# Patient Record
Sex: Female | Born: 1960 | Race: White | Hispanic: No | Marital: Married | Smoking: Never smoker
Health system: Southern US, Community
[De-identification: ages and names within clinical notes are randomized; demographics above are authoritative.]

## PROBLEM LIST (undated history)

## (undated) DIAGNOSIS — J029 Acute pharyngitis, unspecified: Secondary | ICD-10-CM

## (undated) DIAGNOSIS — M5030 Other cervical disc degeneration, unspecified cervical region: Secondary | ICD-10-CM

## (undated) DIAGNOSIS — Z8489 Family history of other specified conditions: Secondary | ICD-10-CM

## (undated) DIAGNOSIS — R112 Nausea with vomiting, unspecified: Secondary | ICD-10-CM

## (undated) DIAGNOSIS — Z9889 Other specified postprocedural states: Secondary | ICD-10-CM

## (undated) DIAGNOSIS — C801 Malignant (primary) neoplasm, unspecified: Secondary | ICD-10-CM

## (undated) DIAGNOSIS — M4316 Spondylolisthesis, lumbar region: Secondary | ICD-10-CM

## (undated) DIAGNOSIS — K589 Irritable bowel syndrome without diarrhea: Secondary | ICD-10-CM

## (undated) DIAGNOSIS — M5431 Sciatica, right side: Secondary | ICD-10-CM

## (undated) DIAGNOSIS — T753XXA Motion sickness, initial encounter: Secondary | ICD-10-CM

## (undated) DIAGNOSIS — M21371 Foot drop, right foot: Secondary | ICD-10-CM

## (undated) DIAGNOSIS — G43909 Migraine, unspecified, not intractable, without status migrainosus: Secondary | ICD-10-CM

## (undated) DIAGNOSIS — D1779 Benign lipomatous neoplasm of other sites: Secondary | ICD-10-CM

## (undated) HISTORY — DX: Irritable bowel syndrome without diarrhea: K58.9

## (undated) HISTORY — PX: OOPHORECTOMY: SHX86

## (undated) HISTORY — PX: TONSILLECTOMY AND ADENOIDECTOMY: SUR1326

## (undated) HISTORY — PX: SKIN BIOPSY: SHX1

## (undated) HISTORY — DX: Migraine, unspecified, not intractable, without status migrainosus: G43.909

---

## 1993-01-09 HISTORY — PX: APPENDECTOMY: SHX54

## 1995-01-10 HISTORY — PX: LAPAROSCOPIC OOPHERECTOMY: SHX6507

## 1996-01-10 HISTORY — PX: CHOLECYSTECTOMY: SHX55

## 1997-01-09 HISTORY — PX: ABDOMINAL HYSTERECTOMY: SHX81

## 2006-02-01 ENCOUNTER — Ambulatory Visit: Payer: Self-pay | Admitting: Internal Medicine

## 2007-01-23 ENCOUNTER — Ambulatory Visit: Payer: Self-pay | Admitting: Internal Medicine

## 2007-02-05 ENCOUNTER — Ambulatory Visit: Payer: Self-pay | Admitting: General Practice

## 2008-02-06 ENCOUNTER — Ambulatory Visit: Payer: Self-pay | Admitting: Internal Medicine

## 2008-03-25 ENCOUNTER — Ambulatory Visit: Payer: Self-pay | Admitting: Obstetrics and Gynecology

## 2008-03-27 ENCOUNTER — Ambulatory Visit: Payer: Self-pay | Admitting: Obstetrics and Gynecology

## 2010-05-03 ENCOUNTER — Ambulatory Visit: Payer: Self-pay | Admitting: Internal Medicine

## 2010-12-16 ENCOUNTER — Ambulatory Visit: Payer: Self-pay | Admitting: General Practice

## 2011-01-10 HISTORY — PX: COLONOSCOPY: SHX174

## 2011-11-14 ENCOUNTER — Ambulatory Visit: Payer: Self-pay | Admitting: General Surgery

## 2012-04-23 ENCOUNTER — Ambulatory Visit: Payer: Self-pay | Admitting: Internal Medicine

## 2013-08-21 ENCOUNTER — Ambulatory Visit: Payer: Self-pay | Admitting: Internal Medicine

## 2014-08-26 ENCOUNTER — Other Ambulatory Visit: Payer: Self-pay | Admitting: Internal Medicine

## 2014-08-26 DIAGNOSIS — Z1231 Encounter for screening mammogram for malignant neoplasm of breast: Secondary | ICD-10-CM

## 2014-09-02 ENCOUNTER — Ambulatory Visit: Payer: Self-pay

## 2014-10-22 ENCOUNTER — Ambulatory Visit
Admission: RE | Admit: 2014-10-22 | Discharge: 2014-10-22 | Disposition: A | Discharge status: Home / Self Care | Payer: BLUE CROSS/BLUE SHIELD | Source: Ambulatory Visit | Attending: Internal Medicine | Admitting: Internal Medicine

## 2014-10-22 DIAGNOSIS — Z1231 Encounter for screening mammogram for malignant neoplasm of breast: Secondary | ICD-10-CM | POA: Diagnosis present

## 2014-10-22 HISTORY — DX: Malignant (primary) neoplasm, unspecified: C80.1

## 2015-08-30 ENCOUNTER — Other Ambulatory Visit: Payer: Self-pay | Admitting: Internal Medicine

## 2015-08-30 DIAGNOSIS — Z1231 Encounter for screening mammogram for malignant neoplasm of breast: Secondary | ICD-10-CM

## 2015-10-25 ENCOUNTER — Ambulatory Visit
Admission: RE | Admit: 2015-10-25 | Discharge: 2015-10-25 | Disposition: A | Discharge status: Home / Self Care | Payer: BLUE CROSS/BLUE SHIELD | Source: Ambulatory Visit | Attending: Internal Medicine | Admitting: Internal Medicine

## 2015-10-25 DIAGNOSIS — Z1231 Encounter for screening mammogram for malignant neoplasm of breast: Secondary | ICD-10-CM | POA: Diagnosis not present

## 2015-11-01 ENCOUNTER — Encounter: Payer: Self-pay | Admitting: *Deleted

## 2015-11-08 ENCOUNTER — Encounter: Payer: Self-pay | Admitting: General Surgery

## 2015-11-08 ENCOUNTER — Ambulatory Visit (INDEPENDENT_AMBULATORY_CARE_PROVIDER_SITE_OTHER): Payer: BLUE CROSS/BLUE SHIELD | Admitting: General Surgery

## 2015-11-08 VITALS — BP 150/78 | HR 85 | Resp 14 | Ht 66.0 in | Wt 209.0 lb

## 2015-11-08 DIAGNOSIS — K625 Hemorrhage of anus and rectum: Secondary | ICD-10-CM

## 2015-11-08 DIAGNOSIS — R1032 Left lower quadrant pain: Secondary | ICD-10-CM | POA: Diagnosis not present

## 2015-11-08 LAB — POC HEMOCCULT BLD/STL (OFFICE/1-CARD/DIAGNOSTIC): FECAL OCCULT BLD: NEGATIVE

## 2015-11-08 MED ORDER — HYOSCYAMINE SULFATE 0.125 MG SL SUBL: 0.1250 mg | SUBLINGUAL_TABLET | SUBLINGUAL | 0 refills | Status: DC | PRN

## 2015-11-08 NOTE — Progress Notes (Signed)
Patient ID: Crystal Bridges, female   DOB: [birth year], 54 y.o.   MRN: [id number]  Chief Complaint  Patient presents with  . Other    rectal bleeding    HPI Crystal Bridges is a 55 y.o. female here for evaluation of abdominal pain and rectal bleeding. She has a history of IBS. She complains of left lower abdominal pain for the past 6 months. She states that the pain almost feels like something twisted. She does report some rectal bleeding after using the bathroom following the pain. This has happened three times with some mucus on the last episode. She states that the pain flairs up and then goes away. It may last 2 hours to 2 days. She does report nausea with most episodes and dry heaves with two. She feels like her symptoms are worsening. Episodes are occurring about twice a month but increasing in intensity. HPI  Past Medical History:  Diagnosis Date  . Cancer ([place])    skin  . IBS (irritable bowel syndrome)   . Migraines     Past Surgical History:  Procedure Laterality Date  . ABDOMINAL HYSTERECTOMY  1999  . APPENDECTOMY  1995  . CHOLECYSTECTOMY  1998  . COLONOSCOPY  2013  . LAPAROSCOPIC OOPHERECTOMY Right 1997  . TONSILLECTOMY AND ADENOIDECTOMY  as child    Family History  Problem Relation Age of Onset  . Thyroid disease Mother   . Dementia Father   . Stroke Father   . Breast cancer Neg Hx     Social History Social History  Substance Use Topics  . Smoking status: Never Smoker  . Smokeless tobacco: Never Used  . Alcohol use No    Allergies  Allergen Reactions  . Penicillins Hives    Current Outpatient Prescriptions  Medication Sig Dispense Refill  . butalbital-acetaminophen-caffeine (FIORICET, ESGIC) 50-325-40 MG tablet Take 1 tablet by mouth as needed.  2  . DULoxetine (CYMBALTA) 30 MG capsule Take 30 mg by mouth daily.  4  . furosemide (LASIX) 20 MG tablet Take 20 mg by mouth as needed.  3  . LORazepam (ATIVAN) 1 MG tablet Take 1 mg by mouth as needed.    .  hyoscyamine (LEVSIN SL) 0.125 MG SL tablet Place 1 tablet (0.125 mg total) under the tongue every 4 (four) hours as needed. 30 tablet 0   No current facility-administered medications for this visit.     Review of Systems Review of Systems  Constitutional: Negative.   HENT: Negative.   Eyes: Negative.   Respiratory: Negative.   Cardiovascular: Negative.   Gastrointestinal: Positive for abdominal distention, abdominal pain (left lower), anal bleeding, constipation and nausea. Negative for blood in stool, diarrhea, rectal pain and vomiting.  Endocrine: Negative.   Genitourinary: Negative.   Musculoskeletal: Negative.   Skin: Negative.   Neurological: Negative.   Hematological: Negative.   Psychiatric/Behavioral: Negative.     Blood pressure (!) 150/78, pulse 85, resp. rate 14, height '5\' 6"'$  (1.676 m), weight 209 lb (94.8 kg).  Physical Exam Physical Exam  Constitutional: She is oriented to person, place, and time. She appears well-developed and well-nourished.  Eyes: Conjunctivae are normal. No scleral icterus.  Neck: Neck supple.  Cardiovascular: Normal rate, regular rhythm and normal heart sounds.   Pulmonary/Chest: Effort normal and breath sounds normal.  Abdominal: Soft. Normal appearance and bowel sounds are normal. There is tenderness in the left lower quadrant.    No abdominal bruits noted.  Genitourinary: Rectum normal. Rectal exam shows guaiac  negative stool.  Lymphadenopathy:    She has no cervical adenopathy.  Neurological: She is alert and oriented to person, place, and time.  Skin: Skin is warm and dry.  Psychiatric: She has a normal mood and affect.    Data Reviewed 10/02/2011 office note was reviewed. Patient was seen at that time for evidence of rectal bleeding and left lower quadrant abdominal pain. Colonoscopy completed on 11/14/2011 was normal.  Laboratory studies completed with her PCP on 08/30/2015 were reviewed. White blood cell count 6400 with normal  differential, hemoglobin 14.0. Platelet count 3 and 1000. Fasting blood sugar 100, creatinine 0.85, estimated GFR 78, liver function studies were normal for a scant elevation of the SGPT at 44. Normal lipid studies. Normal TSH.  Operative report from her 07/03/2001 abdominal exploration and appendectomy completed for severe abdominal pain and abnormal CT scan was reviewed. The CT had suggested a focal abnormality of the small bowel or sigmoid colon with a suggestion of an obstructing lesion. The patient was found to have normal bowel, normal vasculature, fibrotic appendix and a scant amount of free fluid. A 8 mm ellipsoid area was found free floating and this was fat necrosis on pathology.  Assessment    Left lower quadrant abdominal pain with normal colonoscopy in 2013.    Plan     The patient was given a prescription for Levsin 0.125 mg tablets for sublingual use. She is used to successfully in the past.  Follow-up will take place after her upcoming CT scan. Patient has been scheduled for a CT abdomen/pelvis with contrast at [facility] for 11-16-15 at 8:30 am (arrive 8:15 am). Prep: no solids 4 hours prior and pick up prep kit. Patient verbalizes understanding.      This has been scribed by Caryl-Lyn Otis Brace LPN   Robert Bellow 11/09/2015, 12:50 PM

## 2015-11-08 NOTE — Patient Instructions (Signed)
CT abdomen pelvis with contrast 

## 2015-11-09 DIAGNOSIS — R1032 Left lower quadrant pain: Secondary | ICD-10-CM | POA: Insufficient documentation

## 2015-11-09 DIAGNOSIS — K625 Hemorrhage of anus and rectum: Secondary | ICD-10-CM | POA: Insufficient documentation

## 2015-11-16 ENCOUNTER — Ambulatory Visit
Admission: RE | Admit: 2015-11-16 | Discharge: 2015-11-16 | Disposition: A | Discharge status: Home / Self Care | Payer: BLUE CROSS/BLUE SHIELD | Source: Ambulatory Visit | Attending: General Surgery | Admitting: General Surgery

## 2015-11-16 DIAGNOSIS — N281 Cyst of kidney, acquired: Secondary | ICD-10-CM | POA: Diagnosis not present

## 2015-11-16 DIAGNOSIS — Z90710 Acquired absence of both cervix and uterus: Secondary | ICD-10-CM | POA: Diagnosis not present

## 2015-11-16 DIAGNOSIS — K760 Fatty (change of) liver, not elsewhere classified: Secondary | ICD-10-CM | POA: Insufficient documentation

## 2015-11-16 DIAGNOSIS — K625 Hemorrhage of anus and rectum: Secondary | ICD-10-CM | POA: Diagnosis present

## 2015-11-16 DIAGNOSIS — R1032 Left lower quadrant pain: Secondary | ICD-10-CM | POA: Insufficient documentation

## 2015-11-16 MED ORDER — IOPAMIDOL (ISOVUE-300) INJECTION 61%
100.0000 mL | Freq: Once | INTRAVENOUS | Status: AC | PRN
  Administered 2015-11-16: 100 mL via INTRAVENOUS

## 2015-11-17 ENCOUNTER — Encounter: Payer: Self-pay | Admitting: General Surgery

## 2015-11-17 ENCOUNTER — Telehealth: Payer: Self-pay | Admitting: *Deleted

## 2015-11-17 ENCOUNTER — Encounter: Payer: Self-pay | Admitting: *Deleted

## 2015-11-17 NOTE — Telephone Encounter (Signed)
-----   Message from Robert Bellow, MD sent at 11/16/2015  9:31 PM EST ----- Please notify the patient that I reviewed her CT. No troublesome findings. No evidence of blockage. Ask her to report if the Levsin is helpful with the next episode of abdominal pain. Please find out where her most recent colonoscopy was completed and by who. ----- Message ----- From: Interface, Rad Results In Sent: 11/16/2015  11:38 AM To: Robert Bellow, MD

## 2015-11-17 NOTE — Telephone Encounter (Signed)
Notified patient as instructed, patient pleased. Discussed follow-up appointments, patient agrees She will f/u via mychart regarding Levsin status. Last colonoscopy was 2013 Dr Bary Castilla.

## 2015-11-17 NOTE — Telephone Encounter (Signed)
Patient called you back, she stepped away from her desk but she said to call her when you get a chance.

## 2016-05-01 ENCOUNTER — Other Ambulatory Visit: Payer: Self-pay | Admitting: General Surgery

## 2016-05-02 ENCOUNTER — Encounter: Payer: Self-pay | Admitting: *Deleted

## 2016-08-07 ENCOUNTER — Encounter: Payer: Self-pay | Admitting: *Deleted

## 2016-08-14 NOTE — Anesthesia Preprocedure Evaluation (Addendum)
Anesthesia Evaluation  Patient identified by MRN, date of birth, ID band Patient awake    Reviewed: Allergy & Precautions, NPO status   History of Anesthesia Complications (+) PONV and history of anesthetic complications  Airway Mallampati: II  TM Distance: >3 FB     Dental no notable dental hx.    Pulmonary neg pulmonary ROS,    breath sounds clear to auscultation       Cardiovascular negative cardio ROS   Rhythm:Regular Rate:Normal     Neuro/Psych  Headaches, Anxiety    GI/Hepatic IBS   Endo/Other  BMI 34  Renal/GU      Musculoskeletal  (+) Arthritis ,   Abdominal   Peds  Hematology   Anesthesia Other Findings   Reproductive/Obstetrics                            Anesthesia Physical Anesthesia Plan  ASA: II  Anesthesia Plan: MAC   Post-op Pain Management:    Induction:   PONV Risk Score and Plan:   Airway Management Planned:   Additional Equipment:   Intra-op Plan:   Post-operative Plan:   Informed Consent:   Plan Discussed with:   Anesthesia Plan Comments:         Anesthesia Quick Evaluation

## 2016-08-14 NOTE — Discharge Instructions (Signed)
INSTRUCTIONS FOLLOWING OCULOPLASTIC SURGERY °AMY M. FOWLER, MD ° °AFTER YOUR EYE SURGERY, THER ARE MANY THINGS THWIHC YOU, THE PATIENT, CAN DO TO ASSURE THE BEST POSSIBLE RESULT FROM YOUR OPERATION.  THIS SHEET SHOULD BE REFERRED TO WHENEVER QUESTIONS ARISE.  IF THERE ARE ANY QUESTIONS NOT ANSWERED HERE, DO NOT HESITATE TO CALL OUR OFFICE AT [phone] OR [phone].  THERE IS ALWAYS [facility] AVAILABLE TO CALL IF QUESTIONS OR PROBLEMS ARISE. ° °VISION: Your vision may be blurred and out of focus after surgery until you are able to stop using your ointment, swelling resolves and your eye(s) heal. This may take 1 to 2 weeks at the least.  If your vision becomes gradually more dim or dark, this is not normal and you need to call our office immediately. ° °EYE CARE: For the first 48 hours after surgery, use ice packs frequently - “20 minutes on, 20 minutes off” - to help reduce swelling and bruising.  Small bags of frozen peas or corn make good ice packs along with cloths soaked in ice water.  If you are wearing a patch or other type of dressing following surgery, keep this on for the amount of time specified by your doctor.  For the first week following surgery, you will need to treat your stitches with great care.  If is OK to shower, but take care to not allow soapy water to run into your eye(s) to help reduce changes of infection.  You may gently clean the eyelashes and around the eye(s) with cotton balls and sterile water, BUT DO NOT RUB THE STITCHES VIGOROUSLY.  Keeping your stitches moist with ointment will help promote healing with minimal scar formation. ° °ACTIVITY: When you leave the surgery center, you should go home, rest and be inactive.  The eye(s) may feel scratchy and keeping the eyes closed will allow for faster healing.  The first week following surgery, avoid straining (anything making the face turn red) or lifting over 20 pounds.  Additionally, avoid bending which causes your head to go below  your waist.  Using your eyes will NOT harm them, so feel free to read, watch television, use the computer, etc as desired.  Driving depends on each individual, so check with your doctor if you have questions about driving. ° °MEDICATIONS:  You will be given a prescription for an ointment to use 4 times a day on your stitches.  You can use the ointment in your eyes if they feel scratchy or irritated.  If you eyelid(s) don’t close completely when you sleep, put some ointment in your eyes before bedtime. ° °EMERGENCY: If you experience SEVERE EYE PAIN OR HEADACHE UNRELIEVED BY TYLENOL OR PERCOCET, NAUSEA OR VOMITING, WORSENING REDNESS, OR WORSENING VISION (ESPECIALLY VISION THAT WA INITIALLY BETTER) CALL [phone] OR [phone] DURING BUSINESS HOURS OR AFTER HOURS. ° °General Anesthesia, Adult, Care After °These instructions provide you with information about caring for yourself after your procedure. Your health care provider may also give you more specific instructions. Your treatment has been planned according to current medical practices, but problems sometimes occur. Call your health care provider if you have any problems or questions after your procedure. °What can I expect after the procedure? °After the procedure, it is common to have: °· Vomiting. °· A sore throat. °· Mental slowness. ° °It is common to feel: °· Nauseous. °· Cold or shivery. °· Sleepy. °· Tired. °· Sore or achy, even in parts of your body where you did not have surgery. ° °  Follow these instructions at home: °For at least 24 hours after the procedure: °· Do not: °? Participate in activities where you could fall or become injured. °? Drive. °? Use heavy machinery. °? Drink alcohol. °? Take sleeping pills or medicines that cause drowsiness. °? Make important decisions or sign legal documents. °? Take care of children on your own. °· Rest. °Eating and drinking °· If you vomit, drink water, juice, or soup when you can drink without  vomiting. °· Drink enough fluid to keep your urine clear or pale yellow. °· Make sure you have little or no nausea before eating solid foods. °· Follow the diet recommended by your health care provider. °General instructions °· Have a responsible adult stay with you until you are awake and alert. °· Return to your normal activities as told by your health care provider. Ask your health care provider what activities are safe for you. °· Take over-the-counter and prescription medicines only as told by your health care provider. °· If you smoke, do not smoke without supervision. °· Keep all follow-up visits as told by your health care provider. This is important. °Contact a health care provider if: °· You continue to have nausea or vomiting at home, and medicines are not helpful. °· You cannot drink fluids or start eating again. °· You cannot urinate after 8-12 hours. °· You develop a skin rash. °· You have fever. °· You have increasing redness at the site of your procedure. °Get help right away if: °· You have difficulty breathing. °· You have chest pain. °· You have unexpected bleeding. °· You feel that you are having a life-threatening or urgent problem. °This information is not intended to replace advice given to you by your health care provider. Make sure you discuss any questions you have with your health care provider. °Document Released: 04/03/2000 Document Revised: 05/31/2015 Document Reviewed: 12/10/2014 °Elsevier Interactive Patient Education © 2018 [employer] ° °

## 2016-08-15 ENCOUNTER — Ambulatory Visit: Payer: BLUE CROSS/BLUE SHIELD | Admitting: Anesthesiology

## 2016-08-15 ENCOUNTER — Ambulatory Visit
Admission: RE | Admit: 2016-08-15 | Discharge: 2016-08-15 | Disposition: A | Discharge status: Home / Self Care | Payer: BLUE CROSS/BLUE SHIELD | Source: Ambulatory Visit | Attending: Ophthalmology | Admitting: Ophthalmology

## 2016-08-15 ENCOUNTER — Encounter
Admission: RE | Disposition: A | Discharge status: Home / Self Care | Payer: Self-pay | Source: Ambulatory Visit | Attending: Ophthalmology

## 2016-08-15 DIAGNOSIS — M1990 Unspecified osteoarthritis, unspecified site: Secondary | ICD-10-CM | POA: Diagnosis not present

## 2016-08-15 DIAGNOSIS — H02521 Blepharophimosis right upper eyelid: Secondary | ICD-10-CM | POA: Diagnosis not present

## 2016-08-15 DIAGNOSIS — F419 Anxiety disorder, unspecified: Secondary | ICD-10-CM | POA: Diagnosis not present

## 2016-08-15 DIAGNOSIS — Z79899 Other long term (current) drug therapy: Secondary | ICD-10-CM | POA: Diagnosis not present

## 2016-08-15 DIAGNOSIS — H02524 Blepharophimosis left upper eyelid: Secondary | ICD-10-CM | POA: Insufficient documentation

## 2016-08-15 HISTORY — DX: Motion sickness, initial encounter: T75.3XXA

## 2016-08-15 HISTORY — DX: Family history of other specified conditions: Z84.89

## 2016-08-15 HISTORY — DX: Other cervical disc degeneration, unspecified cervical region: M50.30

## 2016-08-15 HISTORY — PX: BROW LIFT: SHX178

## 2016-08-15 HISTORY — PX: PTOSIS REPAIR: SHX6568

## 2016-08-15 HISTORY — DX: Other specified postprocedural states: Z98.890

## 2016-08-15 HISTORY — DX: Nausea with vomiting, unspecified: R11.2

## 2016-08-15 SURGERY — BLEPHAROPLASTY
Anesthesia: Monitor Anesthesia Care | Site: Eye | Laterality: Bilateral

## 2016-08-15 MED ORDER — LACTATED RINGERS IV SOLN
10.0000 mL/h | INTRAVENOUS | Status: DC
  Administered 2016-08-15: 10 mL/h via INTRAVENOUS

## 2016-08-15 MED ORDER — LIDOCAINE-EPINEPHRINE 2 %-1:100000 IJ SOLN
INTRAMUSCULAR | Status: DC | PRN
  Administered 2016-08-15 (×2): .5 mL via OPHTHALMIC
  Administered 2016-08-15: 1 mL via OPHTHALMIC
  Administered 2016-08-15: .5 mL via OPHTHALMIC

## 2016-08-15 MED ORDER — ONDANSETRON HCL 4 MG/2ML IJ SOLN: 4.0000 mg | Freq: Once | INTRAMUSCULAR | Status: DC | PRN

## 2016-08-15 MED ORDER — OXYCODONE-ACETAMINOPHEN 5-325 MG PO TABS
1.0000 | ORAL_TABLET | ORAL | 0 refills | Status: AC | PRN
Start: 2016-08-15 — End: ?

## 2016-08-15 MED ORDER — PROPOFOL 500 MG/50ML IV EMUL
INTRAVENOUS | Status: DC | PRN
  Administered 2016-08-15: 25 ug/kg/min via INTRAVENOUS

## 2016-08-15 MED ORDER — MIDAZOLAM HCL 2 MG/2ML IJ SOLN
INTRAMUSCULAR | Status: DC | PRN
  Administered 2016-08-15: 2 mg via INTRAVENOUS

## 2016-08-15 MED ORDER — ERYTHROMYCIN 5 MG/GM OP OINT: TOPICAL_OINTMENT | OPHTHALMIC | 3 refills | Status: AC

## 2016-08-15 MED ORDER — ACETAMINOPHEN 325 MG PO TABS
325.0000 mg | ORAL_TABLET | ORAL | Status: DC | PRN
  Administered 2016-08-15: 650 mg via ORAL

## 2016-08-15 MED ORDER — ERYTHROMYCIN 5 MG/GM OP OINT
TOPICAL_OINTMENT | OPHTHALMIC | Status: DC | PRN
  Administered 2016-08-15: 1 via OPHTHALMIC

## 2016-08-15 MED ORDER — ALFENTANIL 500 MCG/ML IJ INJ
INJECTION | INTRAVENOUS | Status: DC | PRN
  Administered 2016-08-15: 250 ug via INTRAVENOUS
  Administered 2016-08-15 (×3): 500 ug via INTRAVENOUS

## 2016-08-15 MED ORDER — ACETAMINOPHEN 160 MG/5ML PO SOLN: 325.0000 mg | ORAL | Status: DC | PRN

## 2016-08-15 MED ORDER — TETRACAINE HCL 0.5 % OP SOLN
OPHTHALMIC | Status: DC | PRN
  Administered 2016-08-15: 2 [drp] via OPHTHALMIC

## 2016-08-15 MED ORDER — BSS IO SOLN
INTRAOCULAR | Status: DC | PRN
  Administered 2016-08-15: 10 mL

## 2016-08-15 SURGICAL SUPPLY — 36 items
APPLICATOR COTTON TIP WD 3 STR (MISCELLANEOUS) ×6 IMPLANT
BLADE SURG 15 STRL LF DISP TIS (BLADE) ×1 IMPLANT
BLADE SURG 15 STRL SS (BLADE) ×2
CORD BIP STRL DISP 12FT (MISCELLANEOUS) ×3 IMPLANT
DRAPE HEAD BAR (DRAPES) ×3 IMPLANT
GAUZE SPONGE 4X4 12PLY STRL (GAUZE/BANDAGES/DRESSINGS) ×3 IMPLANT
GAUZE SPONGE NON-WVN 2X2 STRL (MISCELLANEOUS) ×10 IMPLANT
GLOVE SURG LX 7.0 MICRO (GLOVE) ×4
GLOVE SURG LX STRL 7.0 MICRO (GLOVE) ×2 IMPLANT
MARKER SKIN XFINE TIP W/RULER (MISCELLANEOUS) ×3 IMPLANT
NEEDLE FILTER BLUNT 18X 1/2SAF (NEEDLE) ×2
NEEDLE FILTER BLUNT 18X1 1/2 (NEEDLE) ×1 IMPLANT
NEEDLE HYPO 30X.5 LL (NEEDLE) ×6 IMPLANT
PACK DRAPE NASAL/ENT (PACKS) ×3 IMPLANT
SOL PREP PVP 2OZ (MISCELLANEOUS) ×3
SOLUTION PREP PVP 2OZ (MISCELLANEOUS) ×1 IMPLANT
SPONGE VERSALON 2X2 STRL (MISCELLANEOUS) ×20
SUT CHROMIC 4-0 (SUTURE)
SUT CHROMIC 4-0 M2 12X2 ARM (SUTURE)
SUT CHROMIC 5 0 P 3 (SUTURE) IMPLANT
SUT ETHILON 4 0 CL P 3 (SUTURE) IMPLANT
SUT MERSILENE 4-0 S-2 (SUTURE) IMPLANT
SUT PDS AB 4-0 P3 18 (SUTURE) IMPLANT
SUT PLAIN GUT (SUTURE) ×3 IMPLANT
SUT PROLENE 5 0 P 3 (SUTURE) IMPLANT
SUT PROLENE 6 0 P 1 18 (SUTURE) ×6 IMPLANT
SUT SILK 4 0 G 3 (SUTURE) IMPLANT
SUT VIC AB 5-0 P-3 18X BRD (SUTURE) IMPLANT
SUT VIC AB 5-0 P3 18 (SUTURE)
SUT VICRYL 6-0  S14 CTD (SUTURE)
SUT VICRYL 6-0 S14 CTD (SUTURE) IMPLANT
SUT VICRYL 7 0 TG140 8 (SUTURE) IMPLANT
SUTURE CHRMC 4-0 M2 12X2 ARM (SUTURE) IMPLANT
SYR 3ML LL SCALE MARK (SYRINGE) ×3 IMPLANT
SYRINGE 10CC LL (SYRINGE) ×3 IMPLANT
WATER STERILE IRR 250ML POUR (IV SOLUTION) ×3 IMPLANT

## 2016-08-15 NOTE — H&P (Signed)
See the history and physical completed at [facility] on 07/26/16 and scanned into the chart.

## 2016-08-15 NOTE — Op Note (Signed)
Preoperative Diagnosis:  1. Visually significant blepharoptosis bilateral Upper Eyelid(s) 2. Visually significant dermatochalasis bilateral Upper Eyelid(s)  Postoperative Diagnosis:  Same.  Procedure(s) Performed:   1. Blepharoptosis repair with levator aponeurosis advancement bilateral Upper Eyelid(s) 2. Upper eyelid blepharoplasty with excess skin excision  bilateral Upper Eyelid(s)  Surgeon: Crystal Bridges. Crystal Bridges, M.D.  Assistants: none  Anesthesia: MAC  Specimens: None.  Estimated Blood Loss: Minimal.  Complications: None.  Operative Findings: None Dictated  Procedure:   Allergies were reviewed and the patient Penicillins.   After the risks, benefits, complications and alternatives were discussed with the patient, appropriate informed consent was obtained.  While seated in an upright position and looking in primary gaze, the mid pupillary line was marked on the upper eyelid margins bilaterally. The patient was then brought to the operating suite and reclined supine.  Timeout was conducted and the patient was sedated.  Local anesthetic consisting of a 50-50 mixture of 2% lidocaine with epinephrine and 0.75% bupivacaine with added Hylenex was injected subcutaneously to both upper eyelid(s). After adequate local was instilled, the patient was prepped and draped in the usual sterile fashion for eyelid surgery.   Attention was turned to the upper eyelids. A 50m upper eyelid crease incision line was marked with calipers on both upper eyelid(s).  A pinch test was used to estimate the amount of excess skin to remove and this was marked in standard blepharoplasty style fashion. Attention was turned to the  right upper eyelid. A #15 blade was used to open the premarked incision line. A skin only flap was excised and hemostasis was obtained with bipolar cautery.   Westcott scissors were then used to transect through orbicularis down to the tarsal plate. Epitarsus was dissected to create a smooth  surface to suture to. Dissection was then carried superiorly in the plane between orbicularis and orbital septum. Once the preaponeurotic fat pocket was identified, the orbital septum was opened. This revealed the levator and its aponeurosis.    Attention was then turned to the opposite eyelid where the same procedure was performed in the same manner. Hemostasis was obtained with bipolar cautery throughout.   3 interrupted 6-0 Prolene sutures were then passed partial thickness through the tarsal plates of both upper eyelid(s). These sutures were placed in line with the mid pupillary, medial limbal, and lateral limbal lines. The sutures were fixed to the levator aponeurosis and adjusted until a nice lid height and contour were achieved. Once nice symmetry was achieved, the skin incisions were closed with a running 6-0 fast absorbing plain suture. The patient tolerated the procedure well.  Erythromycin ophthalmic ointment was applied to the incision site(s) followed by ice packs. The patient was taken to the recovery area where she recovered without difficulty.  Post-Op Plan/Instructions:  The patient was instructed to use ice packs frequently for the next 48 hours. She was instructed to use erythromycin ophthalmic ointment on her incisions 4 times a day for the next 12 to 14 days. Shewas given a prescription for Percocet for pain control should Tylenol not be effective. [facility] or sooner as needed for problems.  Crystal Bridges M.D. Attending,Ophthalmology

## 2016-08-15 NOTE — Anesthesia Postprocedure Evaluation (Signed)
Anesthesia Post Note  Patient: Crystal Bridges  Procedure(s) Performed: Procedure(s) (LRB): BLEPHAROPLASTY UPPER EYELID WITH EXCESS SKIN (Bilateral) PTOSIS REPAIR RESECT EX (Bilateral)  Patient location during evaluation: PACU Anesthesia Type: MAC Level of consciousness: awake Pain management: pain level controlled Vital Signs Assessment: post-procedure vital signs reviewed and stable Respiratory status: spontaneous breathing, nonlabored ventilation and respiratory function stable Cardiovascular status: stable Postop Assessment: no signs of nausea or vomiting Anesthetic complications: no    Veda Canning

## 2016-08-15 NOTE — Transfer of Care (Signed)
Immediate Anesthesia Transfer of Care Note  Patient: Crystal Bridges  Procedure(s) Performed: Procedure(s): BLEPHAROPLASTY UPPER EYELID WITH EXCESS SKIN (Bilateral) PTOSIS REPAIR RESECT EX (Bilateral)  Patient Location: PACU  Anesthesia Type: MAC  Level of Consciousness: awake, alert  and patient cooperative  Airway and Oxygen Therapy: Patient Spontanous Breathing and Patient connected to supplemental oxygen  Post-op Assessment: Post-op Vital signs reviewed, Patient's Cardiovascular Status Stable, Respiratory Function Stable, Patent Airway and No signs of Nausea or vomiting  Post-op Vital Signs: Reviewed and stable  Complications: No apparent anesthesia complications

## 2016-08-15 NOTE — Anesthesia Procedure Notes (Signed)
Procedure Name: MAC Date/Time: 08/15/2016 11:34 AM Performed by: Janna Arch Pre-anesthesia Checklist: Patient identified, Emergency Drugs available, Suction available and Patient being monitored Patient Re-evaluated:Patient Re-evaluated prior to induction Oxygen Delivery Method: Nasal cannula

## 2016-08-15 NOTE — Interval H&P Note (Signed)
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 After consideration of risks, benefits and other options for treatment, the patient has consented to  Procedure(s): BLEPHAROPLASTY UPPER EYELID WITH EXCESS SKIN (Bilateral) PTOSIS REPAIR RESECT EX (Bilateral) as a surgical intervention .  The patient's history has been reviewed, patient examined, no change in status, stable for surgery.  I have reviewed the patient's chart and labs.  Questions were answered to the patient's satisfaction.     Vickki Muff, Crystal Bridges M

## 2016-08-16 ENCOUNTER — Encounter: Payer: Self-pay | Admitting: Ophthalmology

## 2016-10-06 ENCOUNTER — Other Ambulatory Visit: Payer: Self-pay | Admitting: Internal Medicine

## 2016-10-06 DIAGNOSIS — Z1231 Encounter for screening mammogram for malignant neoplasm of breast: Secondary | ICD-10-CM

## 2016-11-22 ENCOUNTER — Ambulatory Visit
Admission: RE | Admit: 2016-11-22 | Discharge: 2016-11-22 | Disposition: A | Discharge status: Home / Self Care | Payer: BLUE CROSS/BLUE SHIELD | Source: Ambulatory Visit | Attending: Internal Medicine | Admitting: Internal Medicine

## 2016-11-22 DIAGNOSIS — Z1231 Encounter for screening mammogram for malignant neoplasm of breast: Secondary | ICD-10-CM | POA: Insufficient documentation

## 2018-01-15 ENCOUNTER — Other Ambulatory Visit: Payer: Self-pay | Admitting: Internal Medicine

## 2018-01-15 DIAGNOSIS — Z1231 Encounter for screening mammogram for malignant neoplasm of breast: Secondary | ICD-10-CM

## 2018-02-20 ENCOUNTER — Ambulatory Visit
Admission: RE | Admit: 2018-02-20 | Discharge: 2018-02-20 | Disposition: A | Discharge status: Home / Self Care | Payer: 59 | Source: Ambulatory Visit | Attending: Internal Medicine | Admitting: Internal Medicine

## 2018-02-20 DIAGNOSIS — Z1231 Encounter for screening mammogram for malignant neoplasm of breast: Secondary | ICD-10-CM | POA: Diagnosis not present

## 2018-09-02 DIAGNOSIS — E876 Hypokalemia: Secondary | ICD-10-CM | POA: Insufficient documentation

## 2018-09-02 DIAGNOSIS — R319 Hematuria, unspecified: Secondary | ICD-10-CM | POA: Insufficient documentation

## 2018-09-02 DIAGNOSIS — F411 Generalized anxiety disorder: Secondary | ICD-10-CM | POA: Insufficient documentation

## 2018-09-02 DIAGNOSIS — G43009 Migraine without aura, not intractable, without status migrainosus: Secondary | ICD-10-CM | POA: Insufficient documentation

## 2018-09-02 DIAGNOSIS — K582 Mixed irritable bowel syndrome: Secondary | ICD-10-CM | POA: Insufficient documentation

## 2018-09-12 DIAGNOSIS — M4316 Spondylolisthesis, lumbar region: Secondary | ICD-10-CM | POA: Insufficient documentation

## 2018-09-12 DIAGNOSIS — M51369 Other intervertebral disc degeneration, lumbar region without mention of lumbar back pain or lower extremity pain: Secondary | ICD-10-CM | POA: Insufficient documentation

## 2018-09-12 DIAGNOSIS — D1779 Benign lipomatous neoplasm of other sites: Secondary | ICD-10-CM | POA: Insufficient documentation

## 2018-09-12 DIAGNOSIS — M21371 Foot drop, right foot: Secondary | ICD-10-CM | POA: Insufficient documentation

## 2018-09-12 DIAGNOSIS — M5106 Intervertebral disc disorders with myelopathy, lumbar region: Secondary | ICD-10-CM | POA: Insufficient documentation

## 2018-09-12 DIAGNOSIS — M5431 Sciatica, right side: Secondary | ICD-10-CM | POA: Insufficient documentation

## 2018-09-12 DIAGNOSIS — M5136 Other intervertebral disc degeneration, lumbar region: Secondary | ICD-10-CM | POA: Insufficient documentation

## 2018-09-12 DIAGNOSIS — M5432 Sciatica, left side: Secondary | ICD-10-CM | POA: Insufficient documentation

## 2020-02-03 ENCOUNTER — Other Ambulatory Visit: Payer: Self-pay | Admitting: Internal Medicine

## 2020-02-03 DIAGNOSIS — Z1231 Encounter for screening mammogram for malignant neoplasm of breast: Secondary | ICD-10-CM

## 2020-02-20 ENCOUNTER — Other Ambulatory Visit: Payer: Self-pay

## 2020-02-20 ENCOUNTER — Ambulatory Visit
Admission: RE | Admit: 2020-02-20 | Discharge: 2020-02-20 | Disposition: A | Discharge status: Home / Self Care | Payer: PRIVATE HEALTH INSURANCE | Source: Ambulatory Visit | Attending: Internal Medicine | Admitting: Internal Medicine

## 2020-02-20 DIAGNOSIS — Z1231 Encounter for screening mammogram for malignant neoplasm of breast: Secondary | ICD-10-CM | POA: Diagnosis not present

## 2020-10-28 ENCOUNTER — Other Ambulatory Visit (INDEPENDENT_AMBULATORY_CARE_PROVIDER_SITE_OTHER): Payer: Self-pay | Admitting: Nurse Practitioner

## 2020-10-28 DIAGNOSIS — I83813 Varicose veins of bilateral lower extremities with pain: Secondary | ICD-10-CM

## 2020-11-01 ENCOUNTER — Ambulatory Visit (INDEPENDENT_AMBULATORY_CARE_PROVIDER_SITE_OTHER): Payer: PRIVATE HEALTH INSURANCE

## 2020-11-01 ENCOUNTER — Other Ambulatory Visit: Payer: Self-pay

## 2020-11-01 ENCOUNTER — Encounter (INDEPENDENT_AMBULATORY_CARE_PROVIDER_SITE_OTHER): Payer: Self-pay | Admitting: Nurse Practitioner

## 2020-11-01 ENCOUNTER — Ambulatory Visit (INDEPENDENT_AMBULATORY_CARE_PROVIDER_SITE_OTHER): Payer: PRIVATE HEALTH INSURANCE | Admitting: Nurse Practitioner

## 2020-11-01 VITALS — BP 152/88 | HR 74 | Resp 16 | Ht 67.0 in | Wt 173.4 lb

## 2020-11-01 DIAGNOSIS — I83813 Varicose veins of bilateral lower extremities with pain: Secondary | ICD-10-CM

## 2020-11-01 DIAGNOSIS — I83891 Varicose veins of right lower extremities with other complications: Secondary | ICD-10-CM

## 2020-11-07 NOTE — Progress Notes (Signed)
Subjective:    Patient ID: Crystal Bridges, female    DOB: 1960/01/22, 60 y.o.   MRN: [id number] Chief Complaint  Patient presents with   New Patient (Initial Visit)    Ref Hall Busing varicose veins    Crystal Bridges is a 60 year old female that presents for evaluation of symptomatic varicose veins. The patient relates burning and stinging which worsened steadily throughout the course of the day, particularly with standing. The patient also notes an aching and throbbing pain over the varicosities, particularly with prolonged dependent positions. The symptoms are significantly improved with elevation.  Of greater concern for the patient is bleeding from her varicosities.  She had an incident several weeks ago where varicosities on her right lower extremity began bleeding without injury.  Her husband is a Designer, fashion/clothing and was able to control bleeding without issue.   At this point, the symptoms are persistent and severe enough that they're having a negative impact on lifestyle and are interfering with daily activities.  There is no history of DVT, PE or superficial thrombophlebitis. There is no history of ulceration or hemorrhage. The patient denies a significant family history of varicose veins.   The patient has worn graduated compression in the past. At the present time the patient has been using over-the-counter analgesics. There is no history of prior surgical intervention or sclerotherapy.  Today noninvasive study showed no evidence of DVT or superficial thrombophlebitis bilaterally.  No evidence of deep venous insufficiency bilaterally.  No evidence of superficial venous reflux noted bilaterally in the great or short saphenous veins.   Review of Systems  Skin:  Negative for wound.  Hematological:  Bruises/bleeds easily.      Objective:   Physical Exam Vitals reviewed.  HENT:     Head: Normocephalic.  Cardiovascular:     Rate and Rhythm: Normal rate.     Pulses: Normal pulses.   Pulmonary:     Effort: Pulmonary effort is normal.  Skin:    General: Skin is warm and dry.     Comments: Scattered spider varicosities right worse than left  Neurological:     Mental Status: She is alert and oriented to person, place, and time.  Psychiatric:        Mood and Affect: Mood normal.        Behavior: Behavior normal.        Thought Content: Thought content normal.        Judgment: Judgment normal.    BP (!) 152/88 (BP Location: Left Arm)   Pulse 74   Resp 16   Ht 5\' 7"  (1.702 m)   Wt 173 lb 6.4 oz (78.7 kg)   BMI 27.16 kg/m   Past Medical History:  Diagnosis Date   Cancer ([place])    skin   Degenerative disc disease, cervical    no limitations   Family history of adverse reaction to anesthesia    Mother - PONV   IBS (irritable bowel syndrome)    Migraines    3-4x/yr   Motion sickness    back seat of car   PONV (postoperative nausea and vomiting)    nausea only after hysterectomy and colonoscopy    Social History   Socioeconomic History   Marital status: Married    Spouse name: Not on file   Number of children: Not on file   Years of education: Not on file   Highest education level: Not on file  Occupational History   Not on  file  Tobacco Use   Smoking status: Never   Smokeless tobacco: Never  Substance and Sexual Activity   Alcohol use: No   Drug use: No   Sexual activity: Not on file  Other Topics Concern   Not on file  Social History Narrative   Not on file   Social Determinants of Health   Financial Resource Strain: Not on file  Food Insecurity: Not on file  Transportation Needs: Not on file  Physical Activity: Not on file  Stress: Not on file  Social Connections: Not on file  Intimate Partner Violence: Not on file    Past Surgical History:  Procedure Laterality Date   [place] LIFT Bilateral 08/15/2016   Procedure: BLEPHAROPLASTY UPPER EYELID WITH EXCESS SKIN;  Surgeon: Karle Starch, MD;  Location: [place];  Service: Ophthalmology;  Laterality: Bilateral;   CHOLECYSTECTOMY  1998   COLONOSCOPY  2013   Dr Bary Castilla   LAPAROSCOPIC OOPHERECTOMY Right 1997   OOPHORECTOMY     PTOSIS REPAIR Bilateral 08/15/2016   Procedure: PTOSIS REPAIR RESECT EX;  Surgeon: Karle Starch, MD;  Location: [place];  Service: Ophthalmology;  Laterality: Bilateral;   TONSILLECTOMY AND ADENOIDECTOMY  as child    Family History  Problem Relation Age of Onset   Thyroid disease Mother    Dementia Father    Stroke Father    Breast cancer Neg Hx     Allergies  Allergen Reactions   Penicillins Hives    No flowsheet data found.    CMP  No results found for: NA, K, CL, CO2, GLUCOSE, BUN, CREATININE, CALCIUM, PROT, ALBUMIN, AST, ALT, ALKPHOS, BILITOT, GFRNONAA, GFRAA   No results found.     Assessment & Plan:   1. 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  There is concern that there may be recurrent episodes.  Patient should undergo injection sclerotherapy to treat the residual varicosities.  The risks, benefits and alternative therapies were reviewed in detail with the patient.  All questions were answered.  The patient agrees to proceed with sclerotherapy at their convenience.  The patient will continue wearing the graduated compression stockings and using the over-the-counter pain medications to treat her symptoms.        Current Outpatient Medications on File Prior to Visit  Medication Sig Dispense Refill   busPIRone (BUSPAR) 15 MG tablet Take 15 mg by mouth 2 (two) times daily.     butalbital-acetaminophen-caffeine (FIORICET, ESGIC) 50-325-40 MG tablet Take 1 tablet by mouth as needed.  2   LORazepam (ATIVAN) 1 MG tablet Take 1 mg by mouth as needed.     erythromycin [facility]) ophthalmic ointment Use a small amount on  your sutures 4 times a day for the next 2 weeks. Switch to Aquaphor ointment should allergy develop. 3.5 g 3   furosemide (LASIX) 20 MG tablet Take 20 mg by mouth as needed.  3   hyoscyamine (LEVSIN SL) 0.125 MG SL tablet DISSOLVE 1 TABLET(0.125 MG) UNDER THE TONGUE EVERY 4 HOURS AS NEEDED 30 tablet 3   oxyCODONE-acetaminophen (PERCOCET) 5-325 MG tablet Take 1 tablet by mouth every 4 (four) hours as needed for severe pain. 6 tablet 0   No current facility-administered medications on file prior to visit.    There are no Patient Instructions on file  for this visit. No follow-ups on file.   Kris Hartmann, NP

## 2021-01-19 ENCOUNTER — Other Ambulatory Visit: Payer: Self-pay | Admitting: General Surgery

## 2021-01-19 NOTE — Progress Notes (Signed)
Subjective:     Patient ID: Crystal Bridges is a 61 y.o. female.   HPI   The following portions of the patient's history were reviewed and updated as appropriate.   This an established patient is here today for: office visit. The patient is here today to discuss having a colonoscopy. Patient reports she has bowel movements every other day. She does have IBS and can skip a couple of days. The patient denies any mucus. She admits to having a hemorrhoid and bleeding when she wipes only on tissue, bright red, occurs once a week.        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   COLONOSCOPY   11/14/2011    Dr Bary Castilla   REDUCTION OVERCORRECTION PTOSIS Bilateral 08/15/2016   PERCUTANEOUS SPINAL FUSION Bilateral 09/13/2018    Procedure: BILATERAL LUMBAR DECOMPRESSION STABILIZATION; SPINAL FUSION L4-5; EXCISION BILATERAL DISC HERNIATION L4-5; BILATERAL LUMBAR DECOMPRESSION L2-3, L3-4;  DEBRIDEMENT EPIDURAL LIPOMATOSIS L2-3,L3-4, L4-5; LEFT ICBG;  Surgeon: Loni Dolly, MD;  Location: [place];  Service: Orthopedics;  Laterality: Bilateral;   LAMINECTOMY POSTERIOR LUMBAR FACETECTOMY & FORAMINOTOMY W/DECOMP Bilateral 09/13/2018    Procedure: LAMINECTOMY, FACETECTOMY AND FORAMINOTOMY (UNILATERAL OR BILATERAL WITH DECOMPRESSION OF SPINAL CORD, CAUDA EQUINA AND/OR NERVE ROOT(S), SINGLE VERTEBRAL SEGMENT; LUMBAR;  Surgeon: Loni Dolly, MD;  Location: [place];  Service: Orthopedics;  Laterality: Bilateral;   LAMINECTOMY POSTERIOR CERVICLE DECOMP W/FACETECTOMY & FORAMINOTOMY Bilateral 09/13/2018    Procedure: LAMINECTOMY, FACETECTOMY AND FORAMINOTOMY, SINGLE VERTEBRAL SEGMENT; EACH ADDITIONAL SEGMENT, CERVICAL, THORACIC, OR LUMBAR X 2;  Surgeon: Loni Dolly, MD;  Location: [place];  Service: Orthopedics;  Laterality: Bilateral;   AUTOGRAFT STRUCTURAL ICBG OBTAINED SEPARATE INCISION FOR SPINE SURGERY Left 09/13/2018    Procedure: AUTOGRAFT FOR SPINE SURGERY ONLY (INCL HARVESTING GRAFT); STRUCTURAL, BICORTICAL OR TRICORTICAL (THROUGH SEPARATE SKIN OR FASCIAL INCISION);  Surgeon: Loni Dolly, MD;  Location: [place];  Service: Orthopedics;  Laterality: Left;   CHOLECYSTECTOMY       HYSTERECTOMY       OOPHORECTOMY       SKIN BIOPSY       TONSILLECTOMY            OB History   No obstetric history on file.  Obstetric Comments  Age at first period Age of first pregnancy             Social History          Socioeconomic History   Marital status: Married  Tobacco Use   Smoking status: Never   Smokeless tobacco: Never  Vaping Use   Vaping Use: Never used  Substance and Sexual Activity   Alcohol use: Not Currently   Drug use: Not Currently   Sexual activity: Defer             Allergies  Allergen Reactions   Penicillins Hives      Childhood reaction and pt has tolerated it since then.       Current Medications        Current  Outpatient Medications  Medication Sig Dispense Refill   busPIRone (BUSPAR) 7.5 MG tablet Take 7.5 mg by mouth once daily       butalbital-acetaminophen-caffeine (FIORICET) 50-325-40 mg tablet Take 1 tablet by mouth as needed for Pain or Headache       ibuprofen (ADVIL,MOTRIN) 600 MG tablet         LORazepam (ATIVAN) 1 MG tablet Take 1 mg by mouth 2 (two) times daily as needed       cyclobenzaprine (FLEXERIL) 10 MG tablet Take 10 mg by mouth 3 (three) times daily as needed for Muscle spasms (Patient not taking: Reported on 01/18/2021)       hyoscyamine (LEVSIN) 0.125 mg tablet Take 0.125 mg by mouth every 4 (four) hours as needed for Cramping or Diarrhea (Patient not taking: Reported on 01/18/2021)       polyethylene glycol (MIRALAX) powder One bottle for colonoscopy prep. Use as directed. 255 g 0    No current facility-administered medications for this visit.             Family History  Problem Relation Age of Onset   Thyroid disease Mother     Gout Mother     High blood pressure (Hypertension) Mother     Ulcers Mother     Heart murmur Father     Dementia Father     Breast cancer Neg Hx          Labs and Radiology:      November 14, 2011 colonoscopy:   Normal exam, 10-year follow-up recommended           Review of Systems  Constitutional: Negative for chills and fever.  Respiratory: Negative for cough.          Objective:   Physical Exam Exam conducted with a chaperone present.  Constitutional:      Appearance: Normal appearance.  Cardiovascular:     Rate and Rhythm: Normal rate and regular rhythm.     Pulses: Normal pulses.     Heart sounds: Normal heart sounds.  Pulmonary:     Effort: Pulmonary effort is normal.     Breath sounds: Normal breath sounds.  Abdominal:     Tenderness: There is no abdominal tenderness.       Comments: Well-healed midline wound.  Musculoskeletal:     Cervical back: Neck supple.  Skin:    General: Skin is warm and dry.   Neurological:     Mental Status: She is alert and oriented to person, place, and time.  Psychiatric:        Mood and Affect: Mood normal.        Behavior: Behavior normal.  Assessment:     Candidate for colon cancer screening.    Plan:     Options for management reviewed: 1) Cologuard versus 2) colonoscopy.  Pros and cons of each reviewed.  Patient desires colonoscopy.   She was instructed in regards to preparation by the staff.   This will be scheduled convenient date.    This note is partially prepared by Ledell Noss, CMA acting as a scribe in the presence of Dr. Hervey Ard, MD.  This note is partially prepared by Karie Fetch, RN, acting as a scribe in the presence of Dr. Hervey Ard, MD.    The documentation recorded by the scribe accurately reflects the service I personally performed and the decisions made by me.    Robert Bellow, MD FACS

## 2021-03-15 ENCOUNTER — Encounter: Payer: Self-pay | Admitting: General Surgery

## 2021-03-16 ENCOUNTER — Other Ambulatory Visit: Payer: Self-pay

## 2021-03-16 ENCOUNTER — Ambulatory Visit: Payer: 59 | Admitting: Registered Nurse

## 2021-03-16 ENCOUNTER — Ambulatory Visit
Admission: RE | Admit: 2021-03-16 | Discharge: 2021-03-16 | Disposition: A | Discharge status: Home / Self Care | Payer: 59 | Attending: General Surgery | Admitting: General Surgery

## 2021-03-16 ENCOUNTER — Encounter: Payer: Self-pay | Admitting: General Surgery

## 2021-03-16 ENCOUNTER — Encounter
Admission: RE | Disposition: A | Discharge status: Home / Self Care | Payer: Self-pay | Source: Home / Self Care | Attending: General Surgery

## 2021-03-16 DIAGNOSIS — G43909 Migraine, unspecified, not intractable, without status migrainosus: Secondary | ICD-10-CM | POA: Insufficient documentation

## 2021-03-16 DIAGNOSIS — Z1211 Encounter for screening for malignant neoplasm of colon: Secondary | ICD-10-CM | POA: Insufficient documentation

## 2021-03-16 DIAGNOSIS — D128 Benign neoplasm of rectum: Secondary | ICD-10-CM | POA: Diagnosis not present

## 2021-03-16 HISTORY — DX: Spondylolisthesis, lumbar region: M43.16

## 2021-03-16 HISTORY — DX: Acute pharyngitis, unspecified: J02.9

## 2021-03-16 HISTORY — PX: COLONOSCOPY WITH PROPOFOL: SHX5780

## 2021-03-16 HISTORY — DX: Foot drop, right foot: M21.371

## 2021-03-16 HISTORY — DX: Benign lipomatous neoplasm of other sites: D17.79

## 2021-03-16 HISTORY — DX: Sciatica, right side: M54.31

## 2021-03-16 SURGERY — COLONOSCOPY WITH PROPOFOL
Anesthesia: General

## 2021-03-16 MED ORDER — ONDANSETRON HCL 4 MG/2ML IJ SOLN
INTRAMUSCULAR | Status: DC | PRN
  Administered 2021-03-16: 4 mg via INTRAVENOUS

## 2021-03-16 MED ORDER — PROPOFOL 500 MG/50ML IV EMUL
INTRAVENOUS | Status: DC | PRN
  Administered 2021-03-16: 150 ug/kg/min via INTRAVENOUS

## 2021-03-16 MED ORDER — PROPOFOL 10 MG/ML IV BOLUS
INTRAVENOUS | Status: DC | PRN
  Administered 2021-03-16: 10 mg via INTRAVENOUS
  Administered 2021-03-16: 60 mg via INTRAVENOUS
  Administered 2021-03-16: 70 mg via INTRAVENOUS

## 2021-03-16 MED ORDER — PROPOFOL 500 MG/50ML IV EMUL
INTRAVENOUS | Status: AC
  Filled 2021-03-16: qty 50

## 2021-03-16 MED ORDER — DEXMEDETOMIDINE HCL 200 MCG/2ML IV SOLN
INTRAVENOUS | Status: DC | PRN
  Administered 2021-03-16 (×2): 12 ug via INTRAVENOUS
  Administered 2021-03-16: 8 ug via INTRAVENOUS
  Administered 2021-03-16: 12 ug via INTRAVENOUS

## 2021-03-16 MED ORDER — SODIUM CHLORIDE 0.9 % IV SOLN: INTRAVENOUS | Status: DC

## 2021-03-16 MED ORDER — LIDOCAINE HCL (CARDIAC) PF 100 MG/5ML IV SOSY
PREFILLED_SYRINGE | INTRAVENOUS | Status: DC | PRN
  Administered 2021-03-16: 60 mg via INTRAVENOUS
  Administered 2021-03-16: 40 mg via INTRAVENOUS

## 2021-03-16 NOTE — Op Note (Signed)
[facility] ?Gastroenterology ?Patient Name: Crystal Bridges ?Procedure Date: 03/16/2021 9:06 AM ?MRN: [id number] ?Account #: [id number] ?Date of Birth: [birth year] ?Admit Type: Outpatient ?Age: 61 ?Room: [facility] ENDO ROOM 1 ?Gender: Female ?Note Status: Finalized ?Instrument Name: Peds Colonoscope [id number] ?Procedure:             Colonoscopy ?Indications:           Screening for colorectal malignant neoplasm ?Providers:             Robert Bellow, MD ?Referring MD:          Leona Carry. Hall Busing, MD (Referring MD) ?Medicines:             Propofol per Anesthesia ?Complications:         No immediate complications. ?Procedure:             Pre-Anesthesia Assessment: ?                       - Prior to the procedure, a History and Physical was  ?                       performed, and patient medications, allergies and  ?                       sensitivities were reviewed. The patient's tolerance  ?                       of previous anesthesia was reviewed. ?                       - The risks and benefits of the procedure and the  ?                       sedation options and risks were discussed with the  ?                       patient. All questions were answered and informed  ?                       consent was obtained. ?                       After obtaining informed consent, the colonoscope was  ?                       passed under direct vision. Throughout the procedure,  ?                       the patient's blood pressure, pulse, and oxygen  ?                       saturations were monitored continuously. The  ?                       Colonoscope was introduced through the anus and  ?                       advanced to the the cecum, identified by its  ?  appearance. The appendiceal orifice was not seen, but  ?                       the scope was identified deep in the right lower  ?                       quadrant. The patient's IV infiltrated during the  ?                       procedure and  was successfully restarted by  ?                       anesthesia. The colonoscopy was performed with  ?                       moderate difficulty due to significant looping and a  ?                       tortuous colon. Successful completion of the procedure  ?                       was aided by using manual pressure. The patient  ?                       tolerated the procedure well. The quality of the bowel  ?                       preparation was excellent. ?Findings: ?     A 10 mm polyp was found in the distal rectum. The polyp was  ?     semi-pedunculated. The polyp was removed with a hot snare. Resection and  ?     retrieval were complete. ?     The retroflexed view of the distal rectum and anal verge was normal and  ?     showed no anal or rectal abnormalities. ?Impression:            - One 10 mm polyp in the distal rectum, removed with a  ?                       hot snare. Resected and retrieved. ?                       - The distal rectum and anal verge are normal on  ?                       retroflexion view. ?Recommendation:        - Telephone endoscopist for pathology results in 1  ?                       week. ?Procedure Code(s):     --- Professional --- ?                       [phone], Colonoscopy, flexible; with removal of  ?                       tumor(s), polyp(s), or other lesion(s) by snare  ?  technique ?Diagnosis Code(s):     --- Professional --- ?                       Z12.11, Encounter for screening for malignant neoplasm  ?                       of colon ?                       K62.1, Rectal polyp ?CPT copyright 2019 [employer]. All rights reserved. ?The codes documented in this report are preliminary and upon coder review may  ?be revised to meet current compliance requirements. ?Robert Bellow, MD ?03/16/2021 9:51:47 AM ?This report has been signed electronically. ?Number of Addenda: 0 ?Note Initiated On: 03/16/2021 9:06 AM ?Scope Withdrawal Time: 0  hours 23 minutes 13 seconds  ?Total Procedure Duration: 0 hours 35 minutes 33 seconds  ?Estimated Blood Loss:  Estimated blood loss: none. ?     [facility] ?

## 2021-03-16 NOTE — Addendum Note (Signed)
Addendum  created 03/16/21 1043 by Almendra Loria, Precious Haws, MD  ? Clinical Note Signed, Intraprocedure Event edited  ?  ?

## 2021-03-16 NOTE — Anesthesia Preprocedure Evaluation (Signed)
Anesthesia Evaluation  ?Patient identified by MRN, date of birth, ID band ?Patient awake ? ? ? ?Reviewed: ?Allergy & Precautions, NPO status , Patient's Chart, lab work & pertinent test results ? ?History of Anesthesia Complications ?(+) PONV, Family history of anesthesia reaction and history of anesthetic complications ? ?Airway ?Mallampati: III ? ?TM Distance: <3 FB ?Neck ROM: full ? ? ? Dental ? ?(+) Chipped ?  ?Pulmonary ?neg pulmonary ROS, neg shortness of breath,  ?  ?Pulmonary exam normal ? ? ? ? ? ? ? Cardiovascular ?Exercise Tolerance: Good ?(-) angina(-) Past MI negative cardio ROS ?Normal cardiovascular exam ? ? ?  ?Neuro/Psych ? Headaches, PSYCHIATRIC DISORDERS  Neuromuscular disease   ? GI/Hepatic ?negative GI ROS, Neg liver ROS, neg GERD  ,  ?Endo/Other  ?negative endocrine ROS ? Renal/GU ?negative Renal ROS  ?negative genitourinary ?  ?Musculoskeletal ? ? Abdominal ?  ?Peds ? Hematology ?negative hematology ROS ?(+)   ?Anesthesia Other Findings ?Past Medical History: ?No date: Bilateral sciatica ?No date: Cancer [facility]) ?    Comment:  skin ?No date: Degenerative disc disease, cervical ?    Comment:  no limitations ?No date: Epidural lipomatosis ?No date: Family history of adverse reaction to anesthesia ?    Comment:  Mother - PONV ?No date: IBS (irritable bowel syndrome) ?No date: Migraines ?    Comment:  3-4x/yr ?No date: Motion sickness ?    Comment:  back seat of car ?No date: PONV (postoperative nausea and vomiting) ?    Comment:  nausea only after hysterectomy and colonoscopy ?No date: Right foot drop ?No date: Sore throat ?No date: Spondylolisthesis at L4-L5 level ? ?Past Surgical History: ?1999: ABDOMINAL HYSTERECTOMY ?1995: APPENDECTOMY ?08/15/2016: BROW LIFT; Bilateral ?    Comment:  Procedure: BLEPHAROPLASTY UPPER EYELID WITH EXCESS SKIN; ?             Surgeon: Karle Starch, MD;  Location: [place]  ?             [facility];  Service: Ophthalmology;   Laterality: Bilateral; ?1998: CHOLECYSTECTOMY ?2013: COLONOSCOPY ?    Comment:  Dr Bary Castilla ?1997: LAPAROSCOPIC OOPHERECTOMY; Right ?No date: OOPHORECTOMY ?08/15/2016: PTOSIS REPAIR; Bilateral ?    Comment:  Procedure: PTOSIS REPAIR RESECT EX;  Surgeon: Vickki Muff,  ?             Philis Pique, MD;  Location: [place];  Service:  ?             Ophthalmology;  Laterality: Bilateral; ?No date: SKIN BIOPSY ?as child: TONSILLECTOMY AND ADENOIDECTOMY ? ? ? ? Reproductive/Obstetrics ?negative OB ROS ? ?  ? ? ? ? ? ? ? ? ? ? ? ? ? ?  ?  ? ? ? ? ? ? ? ? ?Anesthesia Physical ?Anesthesia Plan ? ?ASA: 3 ? ?Anesthesia Plan: General  ? ?Post-op Pain Management:   ? ?Induction: Intravenous ? ?PONV Risk Score and Plan: Propofol infusion and TIVA ? ?Airway Management Planned: Natural Airway and Nasal Cannula ? ?Additional Equipment:  ? ?Intra-op Plan:  ? ?Post-operative Plan:  ? ?Informed Consent: I have reviewed the patients History and Physical, chart, labs and discussed the procedure including the risks, benefits and alternatives for the proposed anesthesia with the patient or authorized representative who has indicated his/her understanding and acceptance.  ? ? ? ?Dental Advisory Given ? ?Plan Discussed with: Anesthesiologist, CRNA and Surgeon ? ?Anesthesia Plan Comments: (Patient consented for risks of anesthesia including but not limited to:  ?-  adverse reactions to medications ?- risk of airway placement if required ?- damage to eyes, teeth, lips or other oral mucosa ?- nerve damage due to positioning  ?- sore throat or hoarseness ?- Damage to heart, brain, nerves, lungs, other parts of body or loss of life ? ?Patient voiced understanding.)  ? ? ? ? ? ? ?Anesthesia Quick Evaluation ? ?

## 2021-03-16 NOTE — Transfer of Care (Signed)
Immediate Anesthesia Transfer of Care Note ? ?Patient: Crystal Bridges ? ?Procedure(s) Performed: COLONOSCOPY WITH PROPOFOL ? ?Patient Location: Endoscopy Unit ? ?Anesthesia Type:General ? ?Level of Consciousness: drowsy ? ?Airway & Oxygen Therapy: Patient Spontanous Breathing ? ?Post-op Assessment: Report given to RN and Post -op Vital signs reviewed and stable ? ?Post vital signs: Reviewed and stable ? ?Last Vitals:  ?Vitals Value Taken Time  ?BP 90/60 03/16/21 0953  ?Temp    ?Pulse 67 03/16/21 0955  ?Resp 9 03/16/21 0955  ?SpO2 98 % 03/16/21 0955  ?Vitals shown include unvalidated device data. ? ?Last Pain:  ?Vitals:  ? 03/16/21 0952  ?TempSrc:   ?PainSc: 0-No pain  ?   ? ?  ? ?Complications: No notable events documented. ?

## 2021-03-16 NOTE — H&P (Signed)
Crystal Bridges ?[id number] ?[birth year] ? ?  ? ?HPI:  Healthy 61 y.o for screening colonoscopy. Mild nausea without vomiting during prep.  Migraine this AM with NPO status.  ? ?Medications Prior to Admission  ?Medication Sig Dispense Refill Last Dose  ? busPIRone (BUSPAR) 15 MG tablet Take 15 mg by mouth 2 (two) times daily.   03/15/2021  ? butalbital-acetaminophen-caffeine (FIORICET, ESGIC) 50-325-40 MG tablet Take 1 tablet by mouth as needed.  2 Past Month  ? cyclobenzaprine (FLEXERIL) 10 MG tablet Take 10 mg by mouth 3 (three) times daily as needed for muscle spasms.     ? ibuprofen (ADVIL) 600 MG tablet Take 600 mg by mouth every 6 (six) hours as needed.   Past Month  ? LORazepam (ATIVAN) 1 MG tablet Take 1 mg by mouth as needed.   Past Month  ? erythromycin [facility]) ophthalmic ointment Use a small amount on your sutures 4 times a day for the next 2 weeks. Switch to Aquaphor ointment should allergy develop. 3.5 g 3   ? furosemide (LASIX) 20 MG tablet Take 20 mg by mouth as needed. (Patient not taking: Reported on 03/16/2021)  3 Not Taking  ? hyoscyamine (LEVSIN SL) 0.125 MG SL tablet DISSOLVE 1 TABLET(0.125 MG) UNDER THE TONGUE EVERY 4 HOURS AS NEEDED 30 tablet 3   ? oxyCODONE-acetaminophen (PERCOCET) 5-325 MG tablet Take 1 tablet by mouth every 4 (four) hours as needed for severe pain. (Patient not taking: Reported on 03/16/2021) 6 tablet 0 Completed Course  ? ?Allergies  ?Allergen Reactions  ? Penicillins Hives  ? ?Past Medical History:  ?Diagnosis Date  ? Bilateral sciatica   ? Cancer [facility])   ? skin  ? Degenerative disc disease, cervical   ? no limitations  ? Epidural lipomatosis   ? Family history of adverse reaction to anesthesia   ? Mother - PONV  ? IBS (irritable bowel syndrome)   ? Migraines   ? 3-4x/yr  ? Motion sickness   ? back seat of car  ? PONV (postoperative nausea and vomiting)   ? nausea only after hysterectomy and colonoscopy  ? Right foot drop   ? Sore throat   ? Spondylolisthesis at L4-L5 level    ? ?Past Surgical History:  ?Procedure Laterality Date  ? ABDOMINAL HYSTERECTOMY  1999  ? APPENDECTOMY  1995  ? BROW LIFT Bilateral 08/15/2016  ? Procedure: BLEPHAROPLASTY UPPER EYELID WITH EXCESS SKIN;  Surgeon: Karle Starch, MD;  Location: [facility];  Service: Ophthalmology;  Laterality: Bilateral;  ? CHOLECYSTECTOMY  1998  ? COLONOSCOPY  2013  ? Dr Bary Castilla  ? LAPAROSCOPIC OOPHERECTOMY Right 1997  ? OOPHORECTOMY    ? PTOSIS REPAIR Bilateral 08/15/2016  ? Procedure: PTOSIS REPAIR RESECT EX;  Surgeon: Karle Starch, MD;  Location: [place];  Service: Ophthalmology;  Laterality: Bilateral;  ? SKIN BIOPSY    ? TONSILLECTOMY AND ADENOIDECTOMY  as child  ? ?Social History  ? ?Socioeconomic History  ? Marital status: Married  ?  Spouse name: Not on file  ? Number of children: Not on file  ? Years of education: Not on file  ? Highest education level: Not on file  ?Occupational History  ? Not on file  ?Tobacco Use  ? Smoking status: Never  ? Smokeless tobacco: Never  ?Vaping Use  ? Vaping Use: Never used  ?Substance and Sexual Activity  ? Alcohol use: No  ? Drug use: No  ? Sexual activity: Not on file  ?  Other Topics Concern  ? Not on file  ?Social History Narrative  ? Not on file  ? ?Social Determinants of Health  ? ?Financial Resource Strain: Not on file  ?Food Insecurity: Not on file  ?Transportation Needs: Not on file  ?Physical Activity: Not on file  ?Stress: Not on file  ?Social Connections: Not on file  ?Intimate Partner Violence: Not on file  ? ?Social History  ? ?Social History Narrative  ? Not on file  ? ? ? ?ROS: Negative.  ? ? ? ?PE: ?HEENT: Negative. ?Lungs: Clear. ?Cardio: RR. ? ? ?Assessment/Plan: ? ?Proceed with planned endoscopy.  ?Robert Bellow ?03/16/2021 ? ?  ?

## 2021-03-16 NOTE — Anesthesia Postprocedure Evaluation (Addendum)
Anesthesia Post Note ? ?Patient: Crystal Bridges ? ?Procedure(s) Performed: COLONOSCOPY WITH PROPOFOL ? ?Patient location during evaluation: Endoscopy ?Anesthesia Type: General ?Level of consciousness: awake and alert ?Pain management: pain level controlled ?Vital Signs Assessment: post-procedure vital signs reviewed and stable ?Respiratory status: spontaneous breathing, nonlabored ventilation, respiratory function stable and patient connected to nasal cannula oxygen ?Cardiovascular status: blood pressure returned to baseline and stable ?Postop Assessment: no apparent nausea or vomiting ?Comments: Patient had an IV in her right arm that failed during the case, new IV placed in her left wrist.   ?Right arm looks normal, non swollen, no bruising and normal capillary refill at time of discharge.  Patient instructed to contact us, a physician, the ED or 911 if she develops any new pain, swelling or has any other concerns about her arm.  Patient voiced understanding. ? ? ?No notable events documented. ? ? ?Last Vitals:  ?Vitals:  ? 03/16/21 0953 03/16/21 0957  ?BP: 90/60 (!) 89/59  ?Pulse:    ?Resp:    ?Temp:    ?SpO2:    ?  ?Last Pain:  ?Vitals:  ? 03/16/21 1007  ?TempSrc:   ?PainSc: 0-No pain  ? ? ?  ?  ?  ?  ?  ?  ? ?Crystal Bridges ? ? ? ? ?

## 2021-03-17 ENCOUNTER — Encounter: Payer: Self-pay | Admitting: General Surgery

## 2021-03-17 LAB — SURGICAL PATHOLOGY

## 2021-11-07 ENCOUNTER — Encounter (INDEPENDENT_AMBULATORY_CARE_PROVIDER_SITE_OTHER): Payer: Self-pay

## 2022-11-20 IMAGING — MG MM DIGITAL SCREENING BILAT W/ TOMO AND CAD
8 series · 8 of 24 positions shown · non-contrast
Comparison: Previous exam(s).

CLINICAL DATA: Screening.

EXAM:
DIGITAL SCREENING BILATERAL MAMMOGRAM WITH TOMOSYNTHESIS AND CAD
TECHNIQUE: Bilateral screening digital craniocaudal and mediolateral oblique
mammograms were obtained. Bilateral screening digital breast
tomosynthesis was performed. The images were evaluated with
computer-aided detection.

[L MLO synth-2D]
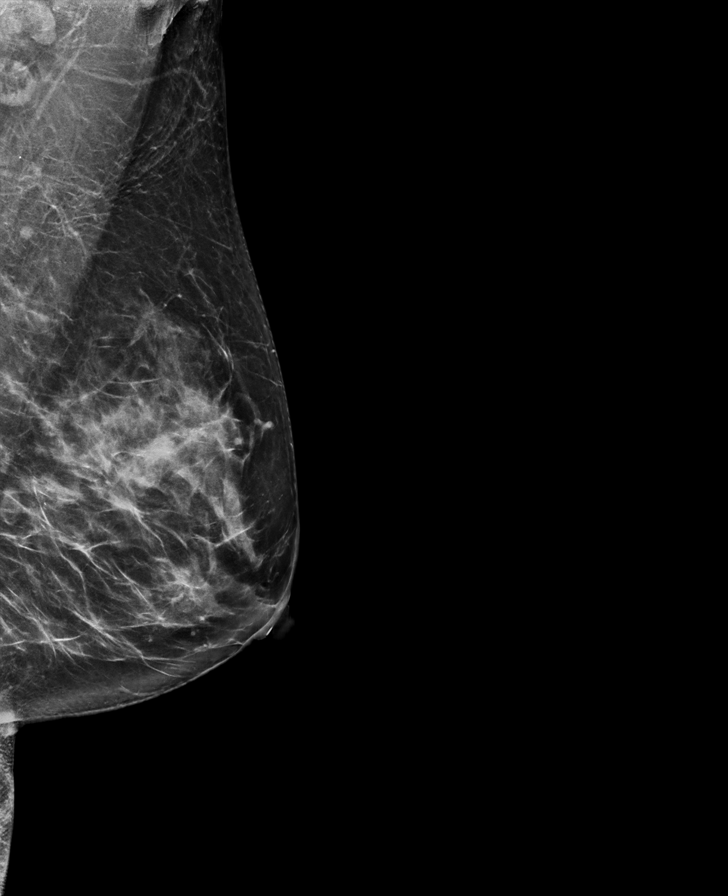

[L CC synth-2D]
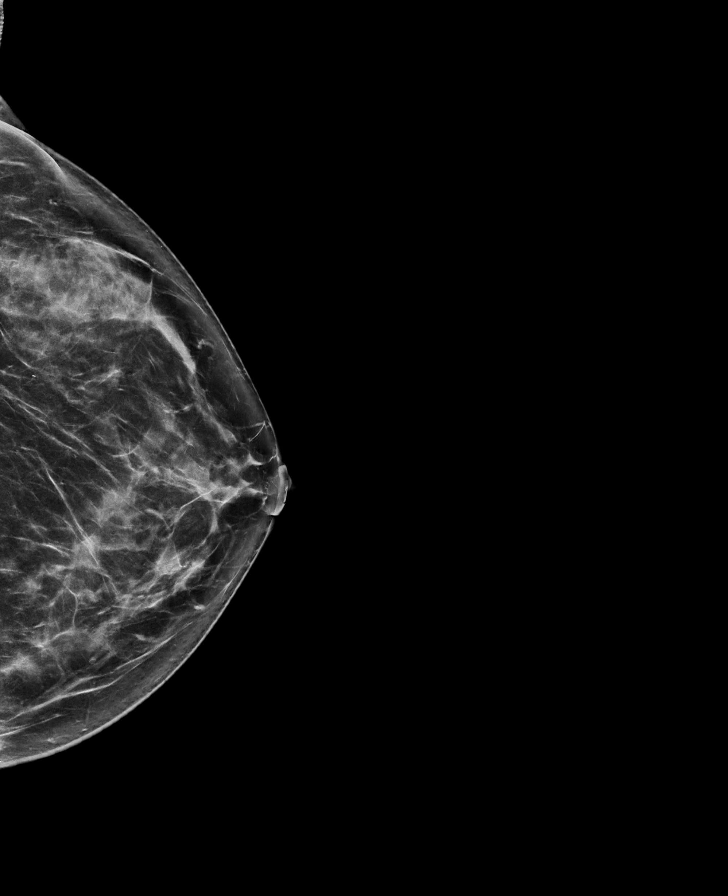

[R MLO synth-2D]
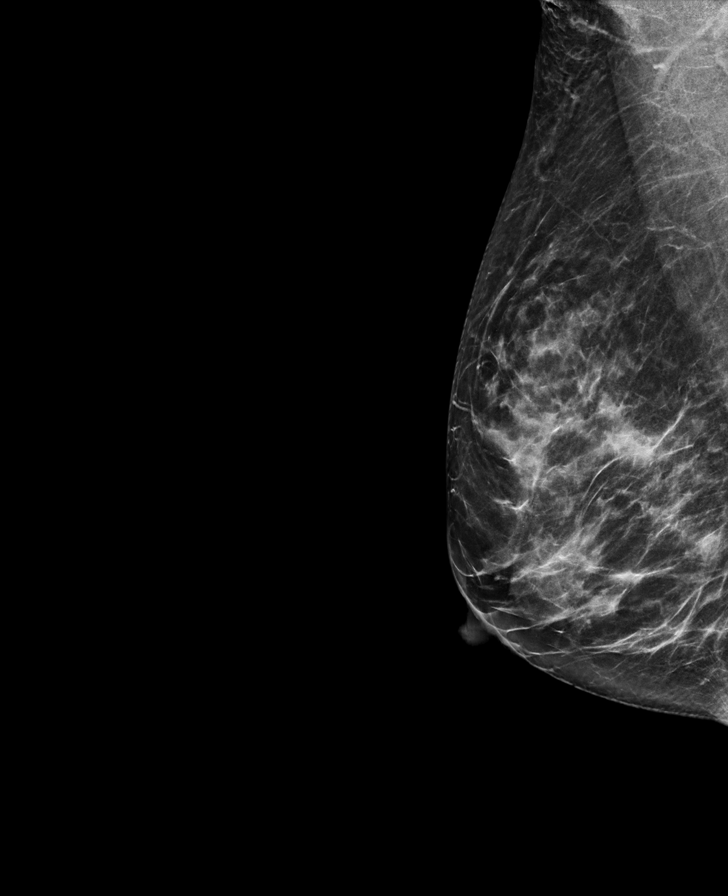

[R CC synth-2D]
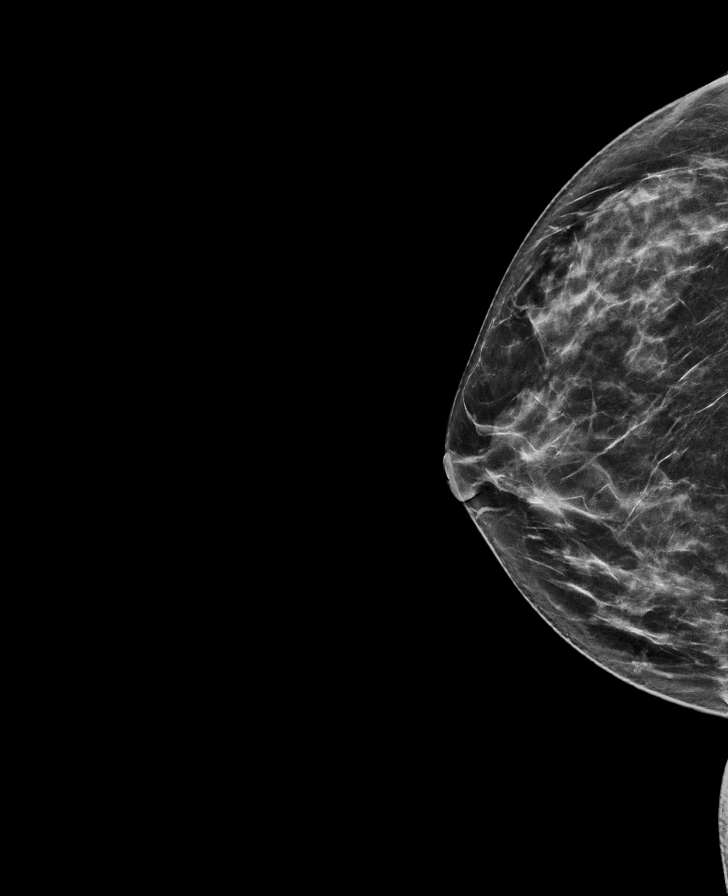

[R MLO tomo · tomo slice 34/67.0]
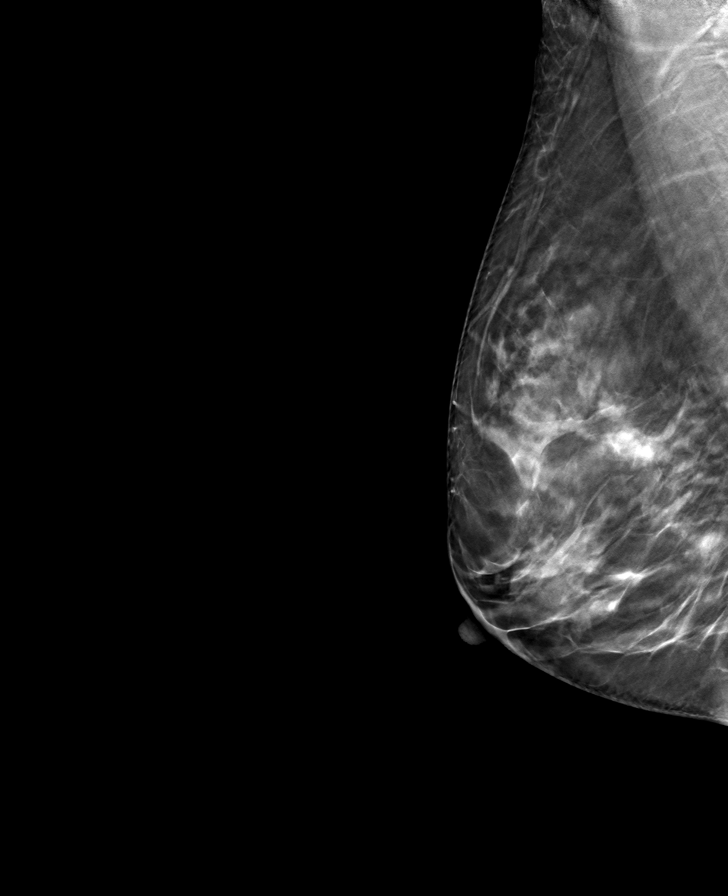

[L MLO tomo · tomo slice 37/74.0]
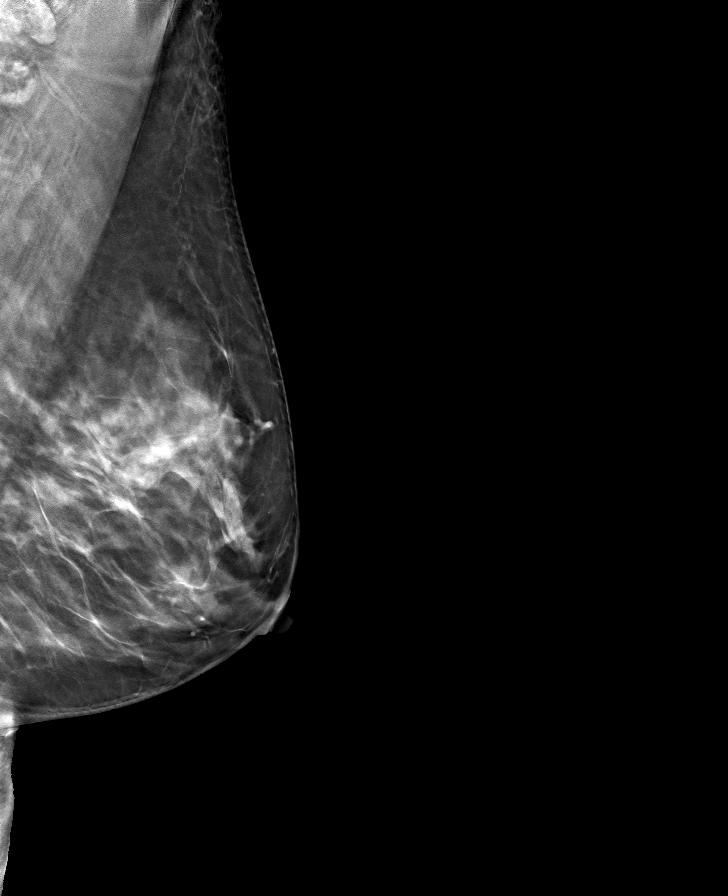

[R CC tomo · tomo slice 35/69.0]
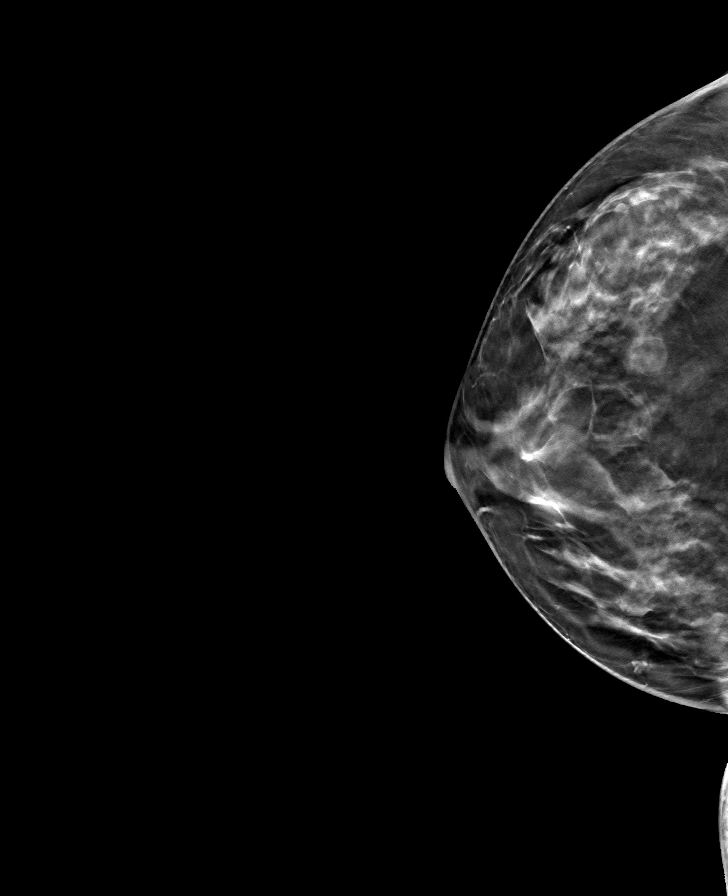

[L CC tomo · tomo slice 37/72.0]
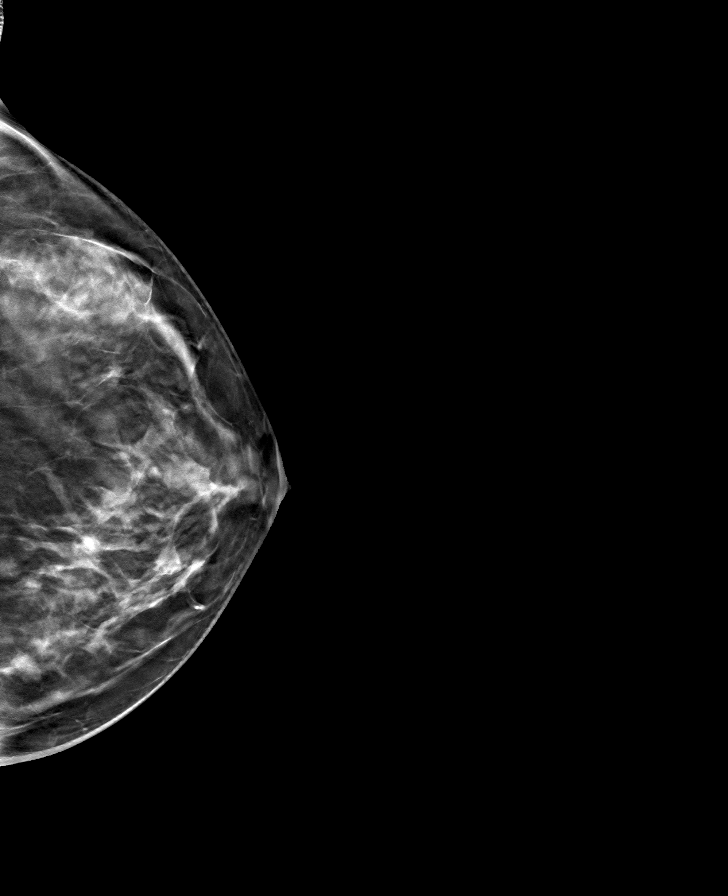

[8 of 24 positions shown; findings below may reference images not displayed]

ACR Breast Density Category c: The breast tissue is heterogeneously
dense, which may obscure small masses.
FINDINGS: There are no findings suspicious for malignancy.
IMPRESSION: No mammographic evidence of malignancy. A result letter of this
screening mammogram will be mailed directly to the patient.

RECOMMENDATION:
Screening mammogram in one year. (Code:Q3-W-BC3)

BI-RADS CATEGORY  1: Negative.

## 2023-06-06 ENCOUNTER — Other Ambulatory Visit: Payer: Self-pay | Admitting: Internal Medicine

## 2023-06-06 DIAGNOSIS — Z1231 Encounter for screening mammogram for malignant neoplasm of breast: Secondary | ICD-10-CM

## 2023-06-29 ENCOUNTER — Ambulatory Visit
Admission: RE | Admit: 2023-06-29 | Discharge: 2023-06-29 | Disposition: A | Discharge status: Home / Self Care | Source: Ambulatory Visit | Attending: Internal Medicine | Admitting: Internal Medicine

## 2023-06-29 DIAGNOSIS — Z1231 Encounter for screening mammogram for malignant neoplasm of breast: Secondary | ICD-10-CM | POA: Diagnosis present

## 2024-02-07 ENCOUNTER — Ambulatory Visit

## 2024-03-06 ENCOUNTER — Ambulatory Visit: Admitting: Family Medicine
